# Patient Record
Sex: Male | Born: 1935 | Race: White | Hispanic: No | Marital: Married
Health system: Southern US, Community
[De-identification: ages and names within clinical notes are randomized; demographics above are authoritative.]

---

## 2015-09-17 ENCOUNTER — Inpatient Hospital Stay
Admission: EM | Admit: 2015-09-17 | Discharge: 2015-10-21 | Disposition: A | Discharge status: Rehabilitation Facility | Payer: Medicare Other | Source: Intra-hospital | Attending: Internal Medicine | Admitting: Internal Medicine

## 2015-09-17 ENCOUNTER — Other Ambulatory Visit (HOSPITAL_COMMUNITY): Payer: Medicare Other

## 2015-09-17 DIAGNOSIS — IMO0002 Reserved for concepts with insufficient information to code with codable children: Secondary | ICD-10-CM

## 2015-09-17 DIAGNOSIS — R188 Other ascites: Secondary | ICD-10-CM

## 2015-09-17 DIAGNOSIS — R0602 Shortness of breath: Secondary | ICD-10-CM

## 2015-09-17 DIAGNOSIS — T8131XS Disruption of external operation (surgical) wound, not elsewhere classified, sequela: Secondary | ICD-10-CM

## 2015-09-17 DIAGNOSIS — R1310 Dysphagia, unspecified: Secondary | ICD-10-CM

## 2015-09-17 DIAGNOSIS — L0291 Cutaneous abscess, unspecified: Secondary | ICD-10-CM

## 2015-09-17 DIAGNOSIS — Z4659 Encounter for fitting and adjustment of other gastrointestinal appliance and device: Secondary | ICD-10-CM

## 2015-09-17 DIAGNOSIS — J189 Pneumonia, unspecified organism: Secondary | ICD-10-CM

## 2015-09-17 DIAGNOSIS — J9690 Respiratory failure, unspecified, unspecified whether with hypoxia or hypercapnia: Secondary | ICD-10-CM

## 2015-09-17 DIAGNOSIS — Z95828 Presence of other vascular implants and grafts: Secondary | ICD-10-CM

## 2015-09-17 DIAGNOSIS — K651 Peritoneal abscess: Secondary | ICD-10-CM

## 2015-09-17 DIAGNOSIS — Z931 Gastrostomy status: Secondary | ICD-10-CM

## 2015-09-18 LAB — COMPREHENSIVE METABOLIC PANEL
ALK PHOS: 130 U/L — AB (ref 38–126)
ALT: 27 U/L (ref 17–63)
ANION GAP: 8 (ref 5–15)
AST: 53 U/L — ABNORMAL HIGH (ref 15–41)
Albumin: 2 g/dL — ABNORMAL LOW (ref 3.5–5.0)
BUN: 20 mg/dL (ref 6–20)
CALCIUM: 8.3 mg/dL — AB (ref 8.9–10.3)
CO2: 27 mmol/L (ref 22–32)
CREATININE: 0.66 mg/dL (ref 0.61–1.24)
Chloride: 101 mmol/L (ref 101–111)
Glucose, Bld: 41 mg/dL — CL (ref 65–99)
Potassium: 3.4 mmol/L — ABNORMAL LOW (ref 3.5–5.1)
Sodium: 136 mmol/L (ref 135–145)
TOTAL PROTEIN: 5 g/dL — AB (ref 6.5–8.1)
Total Bilirubin: 0.8 mg/dL (ref 0.3–1.2)

## 2015-09-18 LAB — CBC WITH DIFFERENTIAL/PLATELET
Basophils Absolute: 0 10*3/uL (ref 0.0–0.1)
Basophils Relative: 0 %
EOS PCT: 0 %
Eosinophils Absolute: 0 10*3/uL (ref 0.0–0.7)
HEMATOCRIT: 28 % — AB (ref 39.0–52.0)
Hemoglobin: 9.3 g/dL — ABNORMAL LOW (ref 13.0–17.0)
LYMPHS ABS: 2.6 10*3/uL (ref 0.7–4.0)
LYMPHS PCT: 25 %
MCH: 28.1 pg (ref 26.0–34.0)
MCHC: 33.2 g/dL (ref 30.0–36.0)
MCV: 84.6 fL (ref 78.0–100.0)
MONO ABS: 1 10*3/uL (ref 0.1–1.0)
MONOS PCT: 10 %
Neutro Abs: 6.6 10*3/uL (ref 1.7–7.7)
Neutrophils Relative %: 65 %
PLATELETS: 413 10*3/uL — AB (ref 150–400)
RBC: 3.31 MIL/uL — ABNORMAL LOW (ref 4.22–5.81)
RDW: 18.1 % — AB (ref 11.5–15.5)
WBC: 10.3 10*3/uL (ref 4.0–10.5)

## 2015-09-18 LAB — PHOSPHORUS: Phosphorus: 3.6 mg/dL (ref 2.5–4.6)

## 2015-09-18 LAB — MAGNESIUM: MAGNESIUM: 1.7 mg/dL (ref 1.7–2.4)

## 2015-09-18 LAB — TSH: TSH: 4.128 u[IU]/mL (ref 0.350–4.500)

## 2015-09-18 LAB — PROCALCITONIN: Procalcitonin: 0.23 ng/mL

## 2015-09-18 LAB — PROTIME-INR
INR: 1.45 (ref 0.00–1.49)
Prothrombin Time: 17.7 seconds — ABNORMAL HIGH (ref 11.6–15.2)

## 2015-09-19 LAB — HEMOGLOBIN A1C
HEMOGLOBIN A1C: 6.4 % — AB (ref 4.8–5.6)
MEAN PLASMA GLUCOSE: 137 mg/dL

## 2015-09-19 LAB — AMIKACIN LEVEL

## 2015-09-21 ENCOUNTER — Other Ambulatory Visit (HOSPITAL_COMMUNITY): Payer: Medicare Other

## 2015-09-21 LAB — BASIC METABOLIC PANEL
Anion gap: 3 — ABNORMAL LOW (ref 5–15)
BUN: 14 mg/dL (ref 6–20)
CHLORIDE: 107 mmol/L (ref 101–111)
CO2: 24 mmol/L (ref 22–32)
Calcium: 7.4 mg/dL — ABNORMAL LOW (ref 8.9–10.3)
Creatinine, Ser: 0.71 mg/dL (ref 0.61–1.24)
Glucose, Bld: 96 mg/dL (ref 65–99)
POTASSIUM: 3.3 mmol/L — AB (ref 3.5–5.1)
SODIUM: 134 mmol/L — AB (ref 135–145)

## 2015-09-21 LAB — CBC
HCT: 22.9 % — ABNORMAL LOW (ref 39.0–52.0)
HEMOGLOBIN: 7.1 g/dL — AB (ref 13.0–17.0)
MCH: 26.8 pg (ref 26.0–34.0)
MCHC: 31 g/dL (ref 30.0–36.0)
MCV: 86.4 fL (ref 78.0–100.0)
Platelets: 206 10*3/uL (ref 150–400)
RBC: 2.65 MIL/uL — AB (ref 4.22–5.81)
RDW: 16.5 % — ABNORMAL HIGH (ref 11.5–15.5)
WBC: 9.9 10*3/uL (ref 4.0–10.5)

## 2015-09-22 ENCOUNTER — Other Ambulatory Visit (HOSPITAL_COMMUNITY): Payer: Medicare Other

## 2015-09-22 LAB — MAGNESIUM: Magnesium: 1.7 mg/dL (ref 1.7–2.4)

## 2015-09-22 LAB — BASIC METABOLIC PANEL
Anion gap: 6 (ref 5–15)
BUN: 16 mg/dL (ref 6–20)
CO2: 22 mmol/L (ref 22–32)
Calcium: 7.8 mg/dL — ABNORMAL LOW (ref 8.9–10.3)
Chloride: 107 mmol/L (ref 101–111)
Creatinine, Ser: 0.78 mg/dL (ref 0.61–1.24)
GFR calc Af Amer: >60 mL/min (ref >60)
GLUCOSE: 94 mg/dL (ref 65–99)
POTASSIUM: 3.2 mmol/L — AB (ref 3.5–5.1)
Sodium: 135 mmol/L (ref 135–145)

## 2015-09-22 LAB — CBC
HEMATOCRIT: 23.2 % — AB (ref 39.0–52.0)
Hemoglobin: 7.4 g/dL — ABNORMAL LOW (ref 13.0–17.0)
MCH: 27.4 pg (ref 26.0–34.0)
MCHC: 31.9 g/dL (ref 30.0–36.0)
MCV: 85.9 fL (ref 78.0–100.0)
PLATELETS: 202 10*3/uL (ref 150–400)
RBC: 2.7 MIL/uL — AB (ref 4.22–5.81)
RDW: 16.3 % — ABNORMAL HIGH (ref 11.5–15.5)
WBC: 11.7 10*3/uL — ABNORMAL HIGH (ref 4.0–10.5)

## 2015-09-22 LAB — URINALYSIS, ROUTINE W REFLEX MICROSCOPIC
Bilirubin Urine: NEGATIVE
GLUCOSE, UA: NEGATIVE mg/dL
Hgb urine dipstick: NEGATIVE
KETONES UR: NEGATIVE mg/dL
LEUKOCYTES UA: NEGATIVE
NITRITE: NEGATIVE
PROTEIN: 100 mg/dL — AB
Specific Gravity, Urine: 1.027 (ref 1.005–1.030)
pH: 6 (ref 5.0–8.0)

## 2015-09-22 LAB — URINE MICROSCOPIC-ADD ON

## 2015-09-23 LAB — URINE CULTURE: Culture: 1000 — AB

## 2015-09-23 NOTE — Progress Notes (Signed)
Patient ID: Marinell BlightJames Spoon, male   DOB: [birth year], 82 y.o.   MRN: [id number]   Request for perc G tube received  CT Abd 7/18: IMPRESSION: 1. Abnormal examination. There is a large serpentine loculated fluid collection within the peritoneal cavity occupying a significant percentage of the peritoneal space and displacing loops of bowel. The collection is not imaged in its entirety and is a amorphous in shape making measurement difficult. There is a second more circumscribed and smaller fluid collection measuring 5.4 x 10.5 cm in the lesser sac. Differential considerations are broad and include loculated ascites due to a multitude of reasons including infection, inflammation, malignancy, prior scar tissue, or in the appropriate clinical setting prior peritoneal dialysis, large pancreatic pseudocysts, and potentially intra-abdominal abscess although this would be unlikely unless the patient were clinically symptomatic. Recommend further evaluation with CT scan of the abdomen and pelvis with oral and intravenous contrast. 2. The tip of the enteric feeding tube is in the proximal jejunum. 3. Bilateral moderate pleural effusions with associated lower lobe atelectasis. 4. Aortic Atherosclerosis (ICD10-170.0). 5. Cholelithiasis.  NOT candidate for percutaneous G tube at this time Note in [facility] chart Can re request after full work up if needed

## 2015-09-24 ENCOUNTER — Other Ambulatory Visit (HOSPITAL_COMMUNITY): Payer: Medicare Other

## 2015-09-24 LAB — VANCOMYCIN, TROUGH: VANCOMYCIN TR: 17 ug/mL (ref 15–20)

## 2015-09-24 MED ORDER — IOPAMIDOL (ISOVUE-300) INJECTION 61%
100.0000 mL | Freq: Once | INTRAVENOUS | Status: AC | PRN
  Administered 2015-09-24: 100 mL via INTRAVENOUS

## 2015-09-24 NOTE — Consult Note (Signed)
Chief Complaint: Patient was seen in consultation today for abdominal abscess(es) drain placement at the request of Dr Carron Curie  Referring Physician(s): Dr Carron Curie  Supervising Physician: Simonne Come  Patient Status: Inpatient  History of Present Illness: Julian Cordova is a 82 y.o. male   Subtotal colectomy 05/2015 Pneumoperitoneum Admitted to [facility] for sepsis; deconditioning  CT 7/20: IMPRESSION: 1. Bilateral pleural effusions with atelectasis. 2. Fluid collections in the abdomen and pelvis as described above. Multiple differential possibilities are possible as detailed in the CT of the abdomen from September 22, 2015. In the appropriate clinical setting, these fluid collections could represent abscesses. However, I would expect the patient to be systemically ill if these are indeed abscesses. . In the lesser sac there is a collection measuring 11 by 5 cm, not significantly changed in the interval. There is a large serpiginous collection encompassing much of the right and left abdomen, extending into the pelvis. It is very difficult to measure the abnormal fluid collections which mostly appear to be contiguous given their unusual shape. A large component is seen in the left side of the abdomen on coronal image 42 measuring 22 by 8 cm. A large component in the right side of the abdomen measures at least 14 by 13 cm. No free flowing fluid identified.  3. Cholelithiasis. There is fluid adjacent to the gallbladder. An ultrasound could better evaluate the gallbladder if clinically warranted. 4. Atherosclerosis in the abdominal aorta. 5. No other acute abnormalities  Request made for abscess (es) drain placement Dr Grace Isaac has reviewed imaging and agreeable to proceed Will need at least 1 drain ; possibly more   No past medical history on file.  No past surgical history on file.  Allergies: Review of patient's allergies indicates not on file.  Medications: Prior to  Admission medications   Not on File     No family history on file.  Social History   Social History  . Marital Status: Married    Spouse Name: N/A  . Number of Children: N/A  . Years of Education: N/A   Social History Main Topics  . Smoking status: Not on file  . Smokeless tobacco: Not on file  . Alcohol Use: Not on file  . Drug Use: Not on file  . Sexual Activity: Not on file   Other Topics Concern  . Not on file   Social History Narrative  . No narrative on file     Review of Systems: A 12 point ROS discussed and pertinent positives are indicated in the HPI above.  All other systems are negative.  Review of Systems  Constitutional:       Cachectic     Vital Signs: There were no vitals taken for this visit.  Physical Exam  Constitutional:  Ill appearing Non verbal   Cardiovascular: Normal rate and regular rhythm.   Pulmonary/Chest: Effort normal and breath sounds normal.  Abdominal: Bowel sounds are normal. He exhibits no distension.  Musculoskeletal:  In fetal position Legs pulled to chest Not contracture per Phys Therapist ---PT was able straighten pts legs to approx 45 degrees- witnessed by me  Neurological:  Follows no commands Not communicative  Skin: Skin is warm.  Psychiatric:  Consented with daughter on phone  Nursing note and vitals reviewed.   Mallampati Score:  MD Evaluation Airway: WNL Heart: WNL Abdomen: WNL Chest/ Lungs: WNL ASA  Classification: 3 Mallampati/Airway Score: Two  Imaging: Ct Abdomen Wo Contrast  09/22/2015  CLINICAL  DATA:  82 year old male with a history of dysphagia. Evaluate anatomic candidacy for percutaneous gastrostomy tube placement. EXAM: CT ABDOMEN WITHOUT CONTRAST TECHNIQUE: Multidetector CT imaging of the abdomen was performed following the standard protocol without IV contrast. COMPARISON:  Abdominal radiograph 09/17/2015 FINDINGS: Lower chest: Bilateral moderate layering pleural effusions with  associated lower lobe atelectasis. Calcification and thickening of the aortic valve. Coronary artery calcifications also noted. No cardiomegaly. No evidence of pericardial effusion. Hepatobiliary: Normal hepatic contour and morphology. No discrete hepatic lesion. Gallstone present within the gallbladder lumen. Pancreas: Atrophic pancreas. Spleen: Within normal limits in size. Adrenals/Urinary Tract: No evidence of urolithiasis or hydronephrosis. No definite mass visualized on this un-enhanced exam. Stomach/Bowel: 5.4 x 10.5 cm low-attenuation fluid collection in the lesser sac displacing the stomach anteriorly. Additionally, there are multiple other large loculated fluid collections which appear to communicate and occupying significant portion of the abdomen. An enteric feeding tube is present. The tip of the tube lies in the proximal jejunum. Vascular/Lymphatic: No pathologically enlarged lymph nodes. No evidence of abdominal aortic aneurysm. Calcifications are present throughout the course the abdominal aorta. Other: None. Musculoskeletal:  No suspicious bone lesions identified. IMPRESSION: 1. Abnormal examination. There is a large serpentine loculated fluid collection within the peritoneal cavity occupying a significant percentage of the peritoneal space and displacing loops of bowel. The collection is not imaged in its entirety and is a amorphous in shape making measurement difficult. There is a second more circumscribed and smaller fluid collection measuring 5.4 x 10.5 cm in the lesser sac. Differential considerations are broad and include loculated ascites due to a multitude of reasons including infection, inflammation, malignancy, prior scar tissue, or in the appropriate clinical setting prior peritoneal dialysis, large pancreatic pseudocysts, and potentially intra-abdominal abscess although this would be unlikely unless the patient were clinically symptomatic. Recommend further evaluation with CT scan of the  abdomen and pelvis with oral and intravenous contrast. 2. The tip of the enteric feeding tube is in the proximal jejunum. 3. Bilateral moderate pleural effusions with associated lower lobe atelectasis. 4. Aortic Atherosclerosis (ICD10-170.0). 5. Cholelithiasis. These results will be called to the ordering clinician or representative by the Radiologist Assistant, and communication documented in the PACS or zVision Dashboard. Electronically Signed   By: Malachy Moan M.D.   On: 09/22/2015 17:03   Ct Chest W Contrast  09/24/2015  CLINICAL DATA:  Shortness of breath. Evaluate fluid collections in the abdomen. EXAM: CT CHEST, ABDOMEN, AND PELVIS WITH CONTRAST TECHNIQUE: Multidetector CT imaging of the chest, abdomen and pelvis was performed following the standard protocol during bolus administration of intravenous contrast. CONTRAST:  ISOVUE-300 IOPAMIDOL (ISOVUE-300) INJECTION 61% COMPARISON:  September 22, 2015 FINDINGS: The patient pulled out his IV and there is very little contrast on the study. CT CHEST A tracheostomy tube is in place. There are bilateral pleural effusions with underlying atelectasis. No suspicious nodules or masses. Shotty reactive nodes in the mediastinum with no adenopathy. The central great vessels are normal in caliber. There are coronary artery calcifications. The heart size normal. No pericardial effusion. CT ABDOMEN AND PELVIS No free air. The patient is status post ostomy placement in the right lower quadrant. The oral contrast all exits via the ostomy and does not extend more distally. There is a feeding tube in the stomach. The stomach is otherwise normal. Visualized small bowel is unremarkable. The patient is status post subtotal colectomy. There is fluid in the rectum surrounding a stool ball. It is difficult to track the  colon more proximally but I believe most of the colon is has been removed. Again noted are multiple fluid collections in the abdomen. In the lesser sac there  is a collection measuring 11 by 5 cm, not significantly changed in the interval. There is a large serpiginous collection encompassing much of the right and left abdomen, extending into the pelvis. It is very difficult to measure the abnormal fluid collections which mostly appear to be contiguous given their unusual shape. A large component is seen in the left side of the abdomen on coronal image 42 measuring 22 by 8 cm. A large component in the right side of the abdomen measures at least 14 by 13 cm. No free flowing fluid identified. No free air. There is fluid adjacent to the gallbladder and stones noted within the gallbladder. No wall thickening. The liver, spleen, adrenal glands, and pancreas are unremarkable. The abdominal aorta is atherosclerotic but non aneurysmal. No suspicious adenopathy. In the pelvis, there is no adenopathy. The rectum is fluid filled with stool ball as described above. The bladder is decompressed with a Foley catheter but appears to be thick-walled superiorly. No other abnormalities in the pelvis. No acute bony abnormality identified. IMPRESSION: 1. Bilateral pleural effusions with atelectasis. 2. Fluid collections in the abdomen and pelvis as described above. Multiple differential possibilities are possible as detailed in the CT of the abdomen from September 22, 2015. In the appropriate clinical setting, these fluid collections could represent abscesses. However, I would expect the patient to be systemically ill if these are indeed abscesses. 3. Cholelithiasis. There is fluid adjacent to the gallbladder. An ultrasound could better evaluate the gallbladder if clinically warranted. 4. Atherosclerosis in the abdominal aorta. 5. No other acute abnormalities. Electronically Signed   By: Gerome Samavid  Williams III M.D   On: 09/24/2015 04:10   Ct Abdomen Pelvis W Contrast  09/24/2015  CLINICAL DATA:  Shortness of breath. Evaluate fluid collections in the abdomen. EXAM: CT CHEST, ABDOMEN, AND PELVIS WITH  CONTRAST TECHNIQUE: Multidetector CT imaging of the chest, abdomen and pelvis was performed following the standard protocol during bolus administration of intravenous contrast. CONTRAST:  100mL ISOVUE-300 IOPAMIDOL (ISOVUE-300) INJECTION 61% COMPARISON:  September 22, 2015 FINDINGS: The patient pulled out his IV and there is very little contrast on the study. CT CHEST A tracheostomy tube is in place. There are bilateral pleural effusions with underlying atelectasis. No suspicious nodules or masses. Shotty reactive nodes in the mediastinum with no adenopathy. The central great vessels are normal in caliber. There are coronary artery calcifications. The heart size normal. No pericardial effusion. CT ABDOMEN AND PELVIS No free air. The patient is status post ostomy placement in the right lower quadrant. The oral contrast all exits via the ostomy and does not extend more distally. There is a feeding tube in the stomach. The stomach is otherwise normal. Visualized small bowel is unremarkable. The patient is status post subtotal colectomy. There is fluid in the rectum surrounding a stool ball. It is difficult to track the colon more proximally but I believe most of the colon is has been removed. Again noted are multiple fluid collections in the abdomen. In the lesser sac there is a collection measuring 11 by 5 cm, not significantly changed in the interval. There is a large serpiginous collection encompassing much of the right and left abdomen, extending into the pelvis. It is very difficult to measure the abnormal fluid collections which mostly appear to be contiguous given their unusual shape. A large  component is seen in the left side of the abdomen on coronal image 42 measuring 22 by 8 cm. A large component in the right side of the abdomen measures at least 14 by 13 cm. No free flowing fluid identified. No free air. There is fluid adjacent to the gallbladder and stones noted within the gallbladder. No wall thickening. The  liver, spleen, adrenal glands, and pancreas are unremarkable. The abdominal aorta is atherosclerotic but non aneurysmal. No suspicious adenopathy. In the pelvis, there is no adenopathy. The rectum is fluid filled with stool ball as described above. The bladder is decompressed with a Foley catheter but appears to be thick-walled superiorly. No other abnormalities in the pelvis. No acute bony abnormality identified. IMPRESSION: 1. Bilateral pleural effusions with atelectasis. 2. Fluid collections in the abdomen and pelvis as described above. Multiple differential possibilities are possible as detailed in the CT of the abdomen from September 22, 2015. In the appropriate clinical setting, these fluid collections could represent abscesses. However, I would expect the patient to be systemically ill if these are indeed abscesses. 3. Cholelithiasis. There is fluid adjacent to the gallbladder. An ultrasound could better evaluate the gallbladder if clinically warranted. 4. Atherosclerosis in the abdominal aorta. 5. No other acute abnormalities. Electronically Signed   By: Gerome Sam III M.D   On: 09/24/2015 04:10   Dg Chest Port 1 View  09/21/2015  CLINICAL DATA:  Pneumonia EXAM: PORTABLE CHEST 1 VIEW COMPARISON:  Portable chest x-ray of September 17, 2015 FINDINGS: The lungs are adequately inflated. Bilateral pleural effusions are more conspicuous perhaps in part due to positioning in a more upright manner. The interstitial markings remain increased. The cardiac silhouette is normal in size. The pulmonary vascularity is indistinct. The tracheostomy appliance tip projects 5 cm above the carina. There is aortic atherosclerosis. The feeding tube tip projects below the inferior margin of the image. IMPRESSION: Moderate-sized bilateral pleural effusions. Left basilar atelectasis or pneumonia. Mild interstitial prominence compatible with edema. The support tubes are in stable position. Aortic atherosclerosis. Electronically Signed    By: David  [place] M.D.   On: 09/21/2015 07:18   Dg Chest Port 1 View  09/17/2015  CLINICAL DATA:  Nasogastric tube placement, respiratory status EXAM: PORTABLE CHEST 1 VIEW COMPARISON:  Portable exam 1629 hours without priors for comparison FINDINGS: Rotated to the RIGHT. Tip of tracheostomy tube projects 5.6 cm above carina. Feeding tube traverses stomach with tip in LEFT upper quadrant question in proximal jejunum. Bibasilar opacities question layered pleural effusions and basilar atelectasis. Skin folds project over LEFT lung. No pneumothorax. Bones demineralized. IMPRESSION: Line and tube positions as above. Bibasilar opacities question small pleural effusions and atelectasis. Electronically Signed   By: Ulyses Southward M.D.   On: 09/17/2015 17:00   Dg Abd Portable 1v  09/17/2015  CLINICAL DATA:  Nasogastric tube placement EXAM: PORTABLE ABDOMEN - 1 VIEW COMPARISON:  Portable exam 1633 hours without priors for comparison. FINDINGS: Feeding tube traverses the esophagus and stomach. Tip is reflected into the LEFT upper quadrant, suspect within proximal jejunum. Paucity of bowel gas. Bibasilar opacities as noted on chest radiograph. Bones demineralized. IMPRESSION: Tip of feeding tube projects over LEFT upper quadrant, suspect within proximal jejunum ; definitive confirmation of feeding tube position can be achieved to injection of a small amount of contrast through the tube and repeat imaging. Electronically Signed   By: Ulyses Southward M.D.   On: 09/17/2015 17:03    Labs:  CBC:  Recent Labs  09/18/15  9147 09/21/15 0500 09/22/15 0714  WBC 10.3 9.9 11.7*  HGB 9.3* 7.1* 7.4*  HCT 28.0* 22.9* 23.2*  PLT 413* 206 202    COAGS:  Recent Labs  09/18/15 0620  INR 1.45    BMP:  Recent Labs  09/18/15 0702 09/21/15 0500 09/22/15 0714  NA 136 134* 135  K 3.4* 3.3* 3.2*  CL 101 107 107  CO2 27 24 22   GLUCOSE 41* 96 94  BUN 20 14 16   CALCIUM 8.3* 7.4* 7.8*  CREATININE 0.66 0.71 0.78    GFRNONAA >60 >60 >60  GFRAA >60 >60 >60    LIVER FUNCTION TESTS:  Recent Labs  09/18/15 0702  BILITOT 0.8  AST 53*  ALT 27  ALKPHOS 130*  PROT 5.0*  ALBUMIN 2.0*    TUMOR MARKERS: No results for input(s): AFPTM, CEA, CA199, CHROMGRNA in the last 8760 hours.  Assessment and Plan:  Abdominal abscesses Scheduled for drain(s) 7/21 in IR Risks and Benefits discussed with the patient's daughter via phone including bleeding, infection, damage to adjacent structures, bowel perforation/fistula connection, and sepsis. All of the patient's daughters questions were answered, agreeable to proceed. Consent signed and in chart.   Thank you for this interesting consult.  I greatly enjoyed meeting Lorenzo Arscott and look forward to participating in their care.  A copy of this report was sent to the requesting provider on this date.  Electronically Signed: Ralene Muskrat A 09/24/2015, 11:17 AM   I spent a total of 40 Minutes    in face to face in clinical consultation, greater than 50% of which was counseling/coordinating care for abd abscess drains

## 2015-09-25 ENCOUNTER — Other Ambulatory Visit (HOSPITAL_COMMUNITY): Payer: Medicare Other

## 2015-09-25 LAB — BASIC METABOLIC PANEL
ANION GAP: 3 — AB (ref 5–15)
BUN: 18 mg/dL (ref 6–20)
CALCIUM: 7.9 mg/dL — AB (ref 8.9–10.3)
CO2: 24 mmol/L (ref 22–32)
CREATININE: 0.64 mg/dL (ref 0.61–1.24)
Chloride: 108 mmol/L (ref 101–111)
GFR calc Af Amer: >60 mL/min (ref >60)
GFR calc non Af Amer: >60 mL/min (ref >60)
GLUCOSE: 83 mg/dL (ref 65–99)
Potassium: 2.9 mmol/L — ABNORMAL LOW (ref 3.5–5.1)
Sodium: 135 mmol/L (ref 135–145)

## 2015-09-25 LAB — CBC
HCT: 19.2 % — ABNORMAL LOW (ref 39.0–52.0)
HEMATOCRIT: 20.3 % — AB (ref 39.0–52.0)
Hemoglobin: 6.2 g/dL — CL (ref 13.0–17.0)
Hemoglobin: 6.1 g/dL — CL (ref 13.0–17.0)
MCH: 26.5 pg (ref 26.0–34.0)
MCH: 27.1 pg (ref 26.0–34.0)
MCHC: 30.5 g/dL (ref 30.0–36.0)
MCHC: 31.8 g/dL (ref 30.0–36.0)
MCV: 86.8 fL (ref 78.0–100.0)
MCV: 85.3 fL (ref 78.0–100.0)
PLATELETS: 230 10*3/uL (ref 150–400)
Platelets: 248 10*3/uL (ref 150–400)
RBC: 2.34 MIL/uL — ABNORMAL LOW (ref 4.22–5.81)
RBC: 2.25 MIL/uL — AB (ref 4.22–5.81)
RDW: 16.2 % — AB (ref 11.5–15.5)
RDW: 16.1 % — ABNORMAL HIGH (ref 11.5–15.5)
WBC: 11.6 10*3/uL — ABNORMAL HIGH (ref 4.0–10.5)
WBC: 11.3 10*3/uL — ABNORMAL HIGH (ref 4.0–10.5)

## 2015-09-25 LAB — MAGNESIUM: Magnesium: 1.8 mg/dL (ref 1.7–2.4)

## 2015-09-25 MED ORDER — MIDAZOLAM HCL 2 MG/2ML IJ SOLN
INTRAMUSCULAR | Status: AC
  Filled 2015-09-25: qty 2

## 2015-09-25 MED ORDER — MIDAZOLAM HCL 2 MG/2ML IJ SOLN
INTRAMUSCULAR | Status: AC | PRN
  Administered 2015-09-25 (×3): 0.5 mg via INTRAVENOUS

## 2015-09-25 NOTE — Sedation Documentation (Signed)
Patient is resting comfortably. 

## 2015-09-25 NOTE — Sedation Documentation (Signed)
One drain in, will place 2nd drain

## 2015-09-25 NOTE — Sedation Documentation (Signed)
2 JP bulb drains in place

## 2015-09-25 NOTE — Sedation Documentation (Signed)
Pt has needed trache suction several times, 02 trache collar on, sats drop to mid- high 80's when needs suctioning

## 2015-09-25 NOTE — Sedation Documentation (Signed)
Patient slightly restless 

## 2015-09-25 NOTE — Procedures (Signed)
Interventional Radiology Procedure Note  Procedure: Peritoneal abscess drainage catheter placement x 2  Complications: None  Estimated Blood Loss: < 10 mL  12 Fr drains placed in inferior and superior areas of large intraabdominal abscesses Turbid yellow fluid draining.  Sample sent for culture. Both drains placed to suction bulb drainage.  Jodi MarbleGlenn T. Fredia SorrowYamagata, M.D Pager:  [phone]

## 2015-09-26 LAB — MAGNESIUM: Magnesium: 1.8 mg/dL (ref 1.7–2.4)

## 2015-09-26 LAB — CATH TIP CULTURE: Culture: NO GROWTH

## 2015-09-26 LAB — COMPREHENSIVE METABOLIC PANEL
ALT: 15 U/L — AB (ref 17–63)
AST: 19 U/L (ref 15–41)
Albumin: <1 g/dL — ABNORMAL LOW (ref 3.5–5.0)
Alkaline Phosphatase: 74 U/L (ref 38–126)
Anion gap: 5 (ref 5–15)
BUN: 17 mg/dL (ref 6–20)
CHLORIDE: 109 mmol/L (ref 101–111)
CO2: 24 mmol/L (ref 22–32)
CREATININE: 0.66 mg/dL (ref 0.61–1.24)
Calcium: 8.1 mg/dL — ABNORMAL LOW (ref 8.9–10.3)
GFR calc Af Amer: >60 mL/min (ref >60)
Glucose, Bld: 105 mg/dL — ABNORMAL HIGH (ref 65–99)
Potassium: 3.6 mmol/L (ref 3.5–5.1)
SODIUM: 138 mmol/L (ref 135–145)
Total Bilirubin: 0.5 mg/dL (ref 0.3–1.2)
Total Protein: 4.4 g/dL — ABNORMAL LOW (ref 6.5–8.1)

## 2015-09-26 LAB — CULTURE, BLOOD (ROUTINE X 2)
CULTURE: NO GROWTH
Culture: NO GROWTH

## 2015-09-26 LAB — CBC
HCT: 20.7 % — ABNORMAL LOW (ref 39.0–52.0)
Hemoglobin: 6.3 g/dL — CL (ref 13.0–17.0)
MCH: 26.7 pg (ref 26.0–34.0)
MCHC: 30.4 g/dL (ref 30.0–36.0)
MCV: 87.7 fL (ref 78.0–100.0)
PLATELETS: 268 10*3/uL (ref 150–400)
RBC: 2.36 MIL/uL — AB (ref 4.22–5.81)
RDW: 16.4 % — ABNORMAL HIGH (ref 11.5–15.5)
WBC: 9.1 10*3/uL (ref 4.0–10.5)

## 2015-09-26 LAB — ABO/RH: ABO/RH(D): O POS

## 2015-09-26 LAB — PREPARE RBC (CROSSMATCH)

## 2015-09-27 LAB — TYPE AND SCREEN
ABO/RH(D): O POS
Antibody Screen: NEGATIVE
Unit division: 0

## 2015-09-27 LAB — CBC
HCT: 26 % — ABNORMAL LOW (ref 39.0–52.0)
HEMOGLOBIN: 8.1 g/dL — AB (ref 13.0–17.0)
MCH: 27.3 pg (ref 26.0–34.0)
MCHC: 31.2 g/dL (ref 30.0–36.0)
MCV: 87.5 fL (ref 78.0–100.0)
PLATELETS: 304 10*3/uL (ref 150–400)
RBC: 2.97 MIL/uL — AB (ref 4.22–5.81)
RDW: 16 % — ABNORMAL HIGH (ref 11.5–15.5)
WBC: 8.8 10*3/uL (ref 4.0–10.5)

## 2015-09-27 LAB — BASIC METABOLIC PANEL
Anion gap: 3 — ABNORMAL LOW (ref 5–15)
BUN: 15 mg/dL (ref 6–20)
CHLORIDE: 110 mmol/L (ref 101–111)
CO2: 25 mmol/L (ref 22–32)
Calcium: 8.3 mg/dL — ABNORMAL LOW (ref 8.9–10.3)
Creatinine, Ser: 0.44 mg/dL — ABNORMAL LOW (ref 0.61–1.24)
GFR calc Af Amer: >60 mL/min (ref >60)
GFR calc non Af Amer: >60 mL/min (ref >60)
GLUCOSE: 99 mg/dL (ref 65–99)
POTASSIUM: 3.8 mmol/L (ref 3.5–5.1)
Sodium: 138 mmol/L (ref 135–145)

## 2015-09-27 LAB — MAGNESIUM: Magnesium: 1.8 mg/dL (ref 1.7–2.4)

## 2015-09-27 LAB — VANCOMYCIN, TROUGH: Vancomycin Tr: 30 ug/mL (ref 15–20)

## 2015-09-28 LAB — VANCOMYCIN, RANDOM: Vancomycin Rm: 18

## 2015-09-29 NOTE — Progress Notes (Signed)
Patient ID: Julian Cordova, male   DOB: 1926/06/11, 82 y.o.   MRN: [id number]    Referring Physician(s): Dr. Carron Curie  Supervising Physician: Ruel Favors  Patient Status: inpt  Chief Complaint: Intra-abdominal fluid collections  Subjective: Patient laying in bed and does not respond to questions.  Allergies: Review of patient's allergies indicates no known allergies.  Medications: Prior to Admission medications   Not on File    Vital Signs: BP (!) 148/64   Pulse 82   Resp (!) 22   SpO2 100%   Physical Exam: Abd: soft, stool has clearly leaked out of his ostomy onto his abdomen.  He is contractured with his legs up in his abdomen.  Both drains are in place with cloudy serous output.  Unable to tell from documentation how much output he is having.  There is at least 20-25cc present in each bulb currently.  Imaging: Ct Image Guided Drainage By Percutaneous Catheter  Result Date: 09/26/2015 CLINICAL DATA:  Large intra-abdominal abscesses requiring percutaneous drainage. EXAM: CT GUIDED CATHETER DRAINAGE OF PERITONEAL ABSCESSES X 2 ANESTHESIA/SEDATION: 1.5 Mg IV Versed Total Moderate Sedation Time:  20 minutes. PROCEDURE: The procedure, risks, benefits, and alternatives were explained to the patient's daughter. Questions regarding the procedure were encouraged and answered. The patient's daughter understands and consents to the procedure. The abdominal wall was prepped with chlorhexidine in a sterile fashion, and a sterile drape was applied covering the operative field. A sterile gown and sterile gloves were used for the procedure. Local anesthesia was provided with 1% Lidocaine. CT was performed of the abdomen and pelvis in a supine position. From a left-sided approach, an 18 gauge trocar needle was advanced into the left pelvic portion of a large peritoneal abscess. Fluid aspiration was performed through the needle. A guidewire was advanced. The percutaneous tract was dilated and a  12 French pigtail drainage catheter placed. The catheter was connected to suction bulb drainage and secured at the skin with a Prolene retention suture and StatLock device. Superior component of a large anterior peritoneal abscess was then localized. From a left anterior approach, an 18 gauge trocar needle was advanced into this collection. After aspirating fluid, a guidewire was advanced and the tract dilated. A 12 French pigtail drainage catheter was then advanced over the wire. This catheter was connected to suction bulb drainage and secured at the skin with a Prolene retention suture and StatLock device. COMPLICATIONS: None FINDINGS: Fluid aspirated from pelvic and more superior abdominal components of large peritoneal abscess fluid collections yielded cloudy yellow fluid. A sample was sent for culture analysis. IMPRESSION: Drainage of 2 separate components of massive intraperitoneal abscesses under CT guidance with placement of 2 separate 12 French pigtail drains. Both drains were connected to suction bulb drainage. Electronically Signed   By: Irish Lack M.D.   On: 09/26/2015 11:16   Ct Image Guided Drainage By Percutaneous Catheter  Result Date: 09/26/2015 CLINICAL DATA:  Large intra-abdominal abscesses requiring percutaneous drainage. EXAM: CT GUIDED CATHETER DRAINAGE OF PERITONEAL ABSCESSES X 2 ANESTHESIA/SEDATION: 1.5 Mg IV Versed Total Moderate Sedation Time:  20 minutes. PROCEDURE: The procedure, risks, benefits, and alternatives were explained to the patient's daughter. Questions regarding the procedure were encouraged and answered. The patient's daughter understands and consents to the procedure. The abdominal wall was prepped with chlorhexidine in a sterile fashion, and a sterile drape was applied covering the operative field. A sterile gown and sterile gloves were used for the procedure. Local anesthesia was provided with 1%  Lidocaine. CT was performed of the abdomen and pelvis in a supine  position. From a left-sided approach, an 18 gauge trocar needle was advanced into the left pelvic portion of a large peritoneal abscess. Fluid aspiration was performed through the needle. A guidewire was advanced. The percutaneous tract was dilated and a 12 French pigtail drainage catheter placed. The catheter was connected to suction bulb drainage and secured at the skin with a Prolene retention suture and StatLock device. Superior component of a large anterior peritoneal abscess was then localized. From a left anterior approach, an 18 gauge trocar needle was advanced into this collection. After aspirating fluid, a guidewire was advanced and the tract dilated. A 12 French pigtail drainage catheter was then advanced over the wire. This catheter was connected to suction bulb drainage and secured at the skin with a Prolene retention suture and StatLock device. COMPLICATIONS: None FINDINGS: Fluid aspirated from pelvic and more superior abdominal components of large peritoneal abscess fluid collections yielded cloudy yellow fluid. A sample was sent for culture analysis. IMPRESSION: Drainage of 2 separate components of massive intraperitoneal abscesses under CT guidance with placement of 2 separate 12 French pigtail drains. Both drains were connected to suction bulb drainage. Electronically Signed   By: Irish Lack M.D.   On: 09/26/2015 11:16    Labs:  CBC:  Recent Labs  09/25/15 0525 09/25/15 0615 09/26/15 0713 09/27/15 0550  WBC 11.6* 11.3* 9.1 8.8  HGB 6.2* 6.1* 6.3* 8.1*  HCT 20.3* 19.2* 20.7* 26.0*  PLT 248 230 268 304    COAGS:  Recent Labs  09/18/15 0620  INR 1.45    BMP:  Recent Labs  09/22/15 0714 09/25/15 0525 09/26/15 0713 09/27/15 0550  NA 135 135 138 138  K 3.2* 2.9* 3.6 3.8  CL 107 108 109 110  CO2 GLUCOSE 94 83 105* 99  BUN CALCIUM 7.8* 7.9* 8.1* 8.3*  CREATININE 0.78 0.64 0.66 0.44*  GFRNONAA >60 >60 >60 >60  GFRAA >60 >60 >60 >60      LIVER FUNCTION TESTS:  Recent Labs  09/18/15 0702 09/26/15 0713  BILITOT 0.8 0.5  AST 53* 19  ALT 27 15*  ALKPHOS 130* 74  PROT 5.0* 4.4*  ALBUMIN 2.0* <1.0*    Assessment and Plan: 1. Intra-abdominal fluid collections, drains x2 placed on 7/21 -cont with current drain care.  When output below 15cc/day, then plan for repeat CT scan. -CX from drains are negative -patient had g-tube request placed initially, but given the amount of intra-abdominal fluid collections, pancreatitis etc, this was turned down for now.  In evaluating the patient currently, he does not appear to have much quality of life.  Consider a goals of care conversation with the family prior to consideration of g-tube placement once intra-abdominal processes improve.  Electronically Signed: Letha Cape 09/29/2015, 9:43 AM   I spent a total of 15 Minutes at the the patient's bedside AND on the patient's hospital floor or unit, greater than 50% of which was counseling/coordinating care for intra-abdominal fluid collections

## 2015-09-30 LAB — AEROBIC/ANAEROBIC CULTURE W GRAM STAIN (SURGICAL/DEEP WOUND): Special Requests: NORMAL

## 2015-09-30 LAB — AEROBIC/ANAEROBIC CULTURE (SURGICAL/DEEP WOUND): CULTURE: NO GROWTH

## 2015-10-01 ENCOUNTER — Other Ambulatory Visit (HOSPITAL_COMMUNITY): Payer: Medicare Other

## 2015-10-01 LAB — BASIC METABOLIC PANEL
Anion gap: 5 (ref 5–15)
BUN: 10 mg/dL (ref 6–20)
CALCIUM: 7.9 mg/dL — AB (ref 8.9–10.3)
CHLORIDE: 101 mmol/L (ref 101–111)
CO2: 26 mmol/L (ref 22–32)
Creatinine, Ser: 0.45 mg/dL — ABNORMAL LOW (ref 0.61–1.24)
GFR calc Af Amer: >60 mL/min (ref >60)
GFR calc non Af Amer: >60 mL/min (ref >60)
GLUCOSE: 94 mg/dL (ref 65–99)
Potassium: 3.6 mmol/L (ref 3.5–5.1)
Sodium: 132 mmol/L — ABNORMAL LOW (ref 135–145)

## 2015-10-01 LAB — CBC
HEMATOCRIT: 23.7 % — AB (ref 39.0–52.0)
HEMOGLOBIN: 7.4 g/dL — AB (ref 13.0–17.0)
MCH: 26.6 pg (ref 26.0–34.0)
MCHC: 31.2 g/dL (ref 30.0–36.0)
MCV: 85.3 fL (ref 78.0–100.0)
Platelets: 382 10*3/uL (ref 150–400)
RBC: 2.78 MIL/uL — ABNORMAL LOW (ref 4.22–5.81)
RDW: 16.1 % — ABNORMAL HIGH (ref 11.5–15.5)
WBC: 9.6 10*3/uL (ref 4.0–10.5)

## 2015-10-02 ENCOUNTER — Encounter (HOSPITAL_BASED_OUTPATIENT_CLINIC_OR_DEPARTMENT_OTHER): Payer: Medicare Other

## 2015-10-02 DIAGNOSIS — M79609 Pain in unspecified limb: Secondary | ICD-10-CM

## 2015-10-02 DIAGNOSIS — M7989 Other specified soft tissue disorders: Secondary | ICD-10-CM

## 2015-10-02 DIAGNOSIS — R609 Edema, unspecified: Secondary | ICD-10-CM

## 2015-10-02 NOTE — Progress Notes (Addendum)
**  Preliminary report by tech** Right upper extremity venous duplex performed. Technically limited study due to trach tubes, bandages, patient muscular contracture, and unresponsiveness. No obvious deep vein thrombosis in the visualized deep system veins. Superficial, non-occlusive thrombus noted in a small segment of the right cephalic vein in the forearm. Incidental finding of a hematoma on the right upper arm. Results given to the patient's nurse, Dois Davenport.  10/02/15 11:07 AM  Olen Cordial RVT

## 2015-10-03 NOTE — Progress Notes (Signed)
Patient ID: Julian Cordova, male   DOB: 23-Dec-1926, 82 y.o.   MRN: [id number]    Referring Physician(s): Hijazi,A  Supervising Physician: Richarda Overlie  Patient Status:  Inpatient  Chief Complaint:  Intra-abdominal fluid collections  Subjective: Patient lying in bed, contracted positioning, does not respond to questions   Allergies: Review of patient's allergies indicates no known allergies.  Medications: Prior to Admission medications   Not on File     Vital Signs: BP (!) 148/64   Pulse 82   Resp (!) 22   SpO2 100%   Physical Exam abdomen soft, left abdominal drains with light yellow  serous fluid /fibrinous debris in JP bulb; dressings dry; negative cultures  Imaging: Dg Chest Port 1 View  Result Date: 10/01/2015 CLINICAL DATA:  Respiratory failure. EXAM: PORTABLE CHEST 1 VIEW COMPARISON:  09/24/2015 . FINDINGS: Tracheostomy tube and feeding tube in stable position. Right PICC line stable position. Cardiomegaly with diffuse bilateral pulmonary infiltrates and bilateral pleural effusions. Findings consistent with congestive heart failure. Slight progress from prior exam. Bilateral pneumonia cannot be excluded. No pneumothorax. IMPRESSION: 1. Tracheostomy tube and feeding tube in stable position. Right PICC line stable position. 2. Cardiomegaly with pulmonary vascular prominence, bilateral interstitial prominence, and bilateral pleural effusions. Findings consistent congestive heart failure. Slight progression from prior exam. Bilateral pneumonia cannot be excluded. Electronically Signed   By: Maisie Fus  Register   On: 10/01/2015 07:24   Labs:  CBC:  Recent Labs  09/25/15 0615 09/26/15 0713 09/27/15 0550 10/01/15 0645  WBC 11.3* 9.1 8.8 9.6  HGB 6.1* 6.3* 8.1* 7.4*  HCT 19.2* 20.7* 26.0* 23.7*  PLT 230 268 304 382    COAGS:  Recent Labs  09/18/15 0620  INR 1.45    BMP:  Recent Labs  09/25/15 0525 09/26/15 0713 09/27/15 0550 10/01/15 0645  NA 135 138  138 132*  K 2.9* 3.6 3.8 3.6  CL 108 109 110 101  CO2 24 24 25 26   GLUCOSE 83 105* 99 94  BUN 18 17 15 10   CALCIUM 7.9* 8.1* 8.3* 7.9*  CREATININE 0.64 0.66 0.44* 0.45*  GFRNONAA >60 >60 >60 >60  GFRAA >60 >60 >60 >60    LIVER FUNCTION TESTS:  Recent Labs  09/18/15 0702 09/26/15 0713  BILITOT 0.8 0.5  AST 53* 19  ALT 27 15*  ALKPHOS 130* 74  PROT 5.0* 4.4*  ALBUMIN 2.0* <1.0*    Assessment and Plan: Intra-abdominal fluid collections, drains x2 placed on 7/21; currently afebrile; drain fluid cultures negative; recommend follow-up CT on 7/31 to assess adequacy of fluid collection drainage   Electronically Signed: D. Jeananne Rama 10/03/2015, 2:31 PM   I spent a total of 15 minutes at the the patient's bedside AND on the patient's hospital floor or unit, greater than 50% of which was counseling/coordinating care for dominant fluid collection drainages

## 2015-10-05 ENCOUNTER — Other Ambulatory Visit (HOSPITAL_COMMUNITY): Payer: Medicare Other

## 2015-10-05 ENCOUNTER — Encounter: Payer: Self-pay | Admitting: Radiology

## 2015-10-05 LAB — TROPONIN I
Troponin I: <0.03 ng/mL (ref <0.03)
Troponin I: <0.03 ng/mL (ref <0.03)

## 2015-10-05 MED ORDER — IOPAMIDOL (ISOVUE-300) INJECTION 61%
100.0000 mL | Freq: Once | INTRAVENOUS | Status: AC | PRN
  Administered 2015-10-13: 100 mL via INTRAVENOUS

## 2015-10-08 LAB — COMPREHENSIVE METABOLIC PANEL
ALT: 21 U/L (ref 17–63)
AST: 30 U/L (ref 15–41)
Albumin: 1.1 g/dL — ABNORMAL LOW (ref 3.5–5.0)
Alkaline Phosphatase: 96 U/L (ref 38–126)
Anion gap: 7 (ref 5–15)
BUN: 22 mg/dL — AB (ref 6–20)
CO2: 24 mmol/L (ref 22–32)
CREATININE: 0.47 mg/dL — AB (ref 0.61–1.24)
Calcium: 8.3 mg/dL — ABNORMAL LOW (ref 8.9–10.3)
Chloride: 102 mmol/L (ref 101–111)
GFR calc Af Amer: >60 mL/min (ref >60)
GFR calc non Af Amer: >60 mL/min (ref >60)
Glucose, Bld: 115 mg/dL — ABNORMAL HIGH (ref 65–99)
Potassium: 4 mmol/L (ref 3.5–5.1)
SODIUM: 133 mmol/L — AB (ref 135–145)
Total Bilirubin: 0.2 mg/dL — ABNORMAL LOW (ref 0.3–1.2)
Total Protein: 5.6 g/dL — ABNORMAL LOW (ref 6.5–8.1)

## 2015-10-08 LAB — CBC
HCT: 21.3 % — ABNORMAL LOW (ref 39.0–52.0)
Hemoglobin: 6.7 g/dL — CL (ref 13.0–17.0)
MCH: 26.8 pg (ref 26.0–34.0)
MCHC: 31.5 g/dL (ref 30.0–36.0)
MCV: 85.2 fL (ref 78.0–100.0)
PLATELETS: 434 10*3/uL — AB (ref 150–400)
RBC: 2.5 MIL/uL — AB (ref 4.22–5.81)
RDW: 16.5 % — ABNORMAL HIGH (ref 11.5–15.5)
WBC: 10.8 10*3/uL — AB (ref 4.0–10.5)

## 2015-10-08 LAB — PREPARE RBC (CROSSMATCH)

## 2015-10-08 NOTE — Progress Notes (Signed)
Patient ID: Julian Cordova, male   DOB: 07-30-1926, 82 y.o.   MRN: [id number]    Referring Physician(s): Dr. Carron Curie  Supervising Physician: Ruel Favors  Patient Status: inpt  Chief Complaint: Intra-abdominal abscesses  Subjective: Patient relatively unresponsive to questions, just lays in bed.  Allergies: Review of patient's allergies indicates no known allergies.  Medications: Prior to Admission medications   Not on File    Vital Signs: BP (!) 148/64   Pulse 82   Resp (!) 22   SpO2 100%   Physical Exam: Abd: unable to evaluate abdomen as patient is contractured up holding pillows covering his torso.  Unable to get to drain sites.  Drains are actually hanging over the side of the bed and have minimal cloudy serous output.  RN reports 10cc drain from both this am.  Imaging: Ct Abdomen Pelvis W Contrast  Result Date: 10/05/2015 CLINICAL DATA:  Followup exam after drainage of abdominal wall collection. EXAM: CT ABDOMEN AND PELVIS WITH CONTRAST TECHNIQUE: Multidetector CT imaging of the abdomen and pelvis was performed using the standard protocol following bolus administration of intravenous contrast. CONTRAST:  100 mL of Isovue-300 intravenous contrast COMPARISON:  09/25/2015 FINDINGS: Peritoneal cavity: There has been improvement when compared to the prior CT following percutaneous drainage. In the pelvis, and pigtail catheter, inserted from the left lower quadrant, has partly decompressed a collection that extends from the anterior para renal fascia at the level of the midpole the left kidney to the central to upper pelvis. In the pelvis, this measures 4.7 x 4.1 cm. In this location on the prior exam the collection measured 20.1 x 8.1 cm. In the left mid abdomen, the collection currently measures 5.8 x 3.9 cm, previously 10.3 x 9.2 cm. In the right anterior upper to mid abdomen, the previously seen collection has been mostly decompressed with a pigtail catheter. A small amount  of fluid in the right lower quadrant persists as does a thin collection of fluid below the level of the catheter. The right lower quadrant collection measures 3.6 cm. Posterior to the stomach, and anterior to the pancreas, in the upper abdomen, there is an oval collection, undrained, currently measuring 8.9 x 4.4 cm, previously 12.0 x 4.6 cm. The decrease in the size of this collection suggests it may communicate with the previously described collection. There are no new abdominal or pelvic fluid collections. A small amount of free fluid collects primarily in the posterior pelvis. Lung bases: Large right and moderate to large left pleural effusions increased in size from the CT dated 09/24/2015. There is associated dependent atelectasis of most of the left lower lobe and the entire visualized right lower lobe. Milder left upper lobe lingula atelectasis. Hepatobiliary: Liver is unremarkable. Small dependent gallstones. No evidence acute cholecystitis. No bile duct dilation. Spleen, pancreas, adrenal glands:  Unremarkable. Kidneys, ureters, bladder: Kidneys are unremarkable. No hydronephrosis. Ureters not well visualized but are nondilated. The bladder is partly decompressed with Foley catheter. A stone projects along the posterior wall of the bladder. There is nondependent air. Wall may be thickened despite the decompressed state. Lymph nodes:  No discrete enlarged lymph nodes. Gastrointestinal: Enteric tube tip projects in the proximal jejunum. Stomach is decompressed. Sigmoid colon appears to terminate with anastomosis staples in the right lower quadrant. There is a right lower quadrant ostomy. No convincing bowel wall thickening or inflammation. No evidence of bowel obstruction. Abdominal wall:  Few subcutaneous edema. IMPRESSION: 1. There has been partial decompression of 2 of the  peritoneal cavity fluid collections as described above. Another fluid collection, which likely communicates with the mostly  decompressed right anterior mid to upper abdomen collection, measures smaller than it did on the prior exam. 2. No new abdominal or pelvic fluid collections. 3. Increasing pleural effusions, right large left moderate to large, with significant associated lung base atelectasis. Electronically Signed   By: Amie Portland M.D.   On: 10/05/2015 15:21    Labs:  CBC:  Recent Labs  09/26/15 0713 09/27/15 0550 10/01/15 0645 10/08/15 0631  WBC 9.1 8.8 9.6 10.8*  HGB 6.3* 8.1* 7.4* 6.7*  HCT 20.7* 26.0* 23.7* 21.3*  PLT 268 304 382 434*    COAGS:  Recent Labs  09/18/15 0620  INR 1.45    BMP:  Recent Labs  09/26/15 0713 09/27/15 0550 10/01/15 0645 10/08/15 0631  NA 138 138 132* 133*  K 3.6 3.8 3.6 4.0  CL 109 110 101 102  CO2 GLUCOSE 105* 99 94 115*  BUN 22*  CALCIUM 8.1* 8.3* 7.9* 8.3*  CREATININE 0.66 0.44* 0.45* 0.47*  GFRNONAA >60 >60 >60 >60  GFRAA >60 >60 >60 >60    LIVER FUNCTION TESTS:  Recent Labs  09/18/15 0702 09/26/15 0713 10/08/15 0631  BILITOT 0.8 0.5 0.2*  AST 53* 19 30  ALT 27 15* 21  ALKPHOS 130* 74 96  PROT 5.0* 4.4* 5.6*  ALBUMIN 2.0* <1.0* 1.1*    Assessment and Plan: 1. Intra-abdominal abscesses, s/p perc drain x2, 7/21 -drains are stable with minimal output.  Cont with drain flushes. -last CT was on 7/31.  Sometime within the next 2-4 days, consider repeat CT scan to further evaluate the drains. -will follow.  Electronically Signed: Letha Cape 10/08/2015, 12:19 PM   I spent a total of 15 Minutes at the the patient's bedside AND on the patient's hospital floor or unit, greater than 50% of which was counseling/coordinating care for intra-abdominal abscesses

## 2015-10-09 LAB — TYPE AND SCREEN
ABO/RH(D): O POS
Antibody Screen: NEGATIVE
UNIT DIVISION: 0

## 2015-10-10 ENCOUNTER — Other Ambulatory Visit (HOSPITAL_COMMUNITY): Payer: Medicare Other

## 2015-10-10 LAB — CBC
HCT: 22.4 % — ABNORMAL LOW (ref 39.0–52.0)
HEMOGLOBIN: 7.1 g/dL — AB (ref 13.0–17.0)
MCH: 27.3 pg (ref 26.0–34.0)
MCHC: 31.7 g/dL (ref 30.0–36.0)
MCV: 86.2 fL (ref 78.0–100.0)
Platelets: 351 10*3/uL (ref 150–400)
RBC: 2.6 MIL/uL — ABNORMAL LOW (ref 4.22–5.81)
RDW: 16.2 % — ABNORMAL HIGH (ref 11.5–15.5)
WBC: 9.7 10*3/uL (ref 4.0–10.5)

## 2015-10-13 ENCOUNTER — Other Ambulatory Visit (HOSPITAL_COMMUNITY): Payer: Medicare Other

## 2015-10-14 ENCOUNTER — Other Ambulatory Visit (HOSPITAL_COMMUNITY): Payer: Medicare Other

## 2015-10-14 MED ORDER — LIDOCAINE VISCOUS 2 % MT SOLN
15.0000 mL | Freq: Once | OROMUCOSAL | Status: AC
  Administered 2015-10-14: 2.5 mL via OROMUCOSAL

## 2015-10-14 MED ORDER — DIATRIZOATE MEGLUMINE & SODIUM 66-10 % PO SOLN
30.0000 mL | Freq: Once | ORAL | Status: AC
  Administered 2015-10-14: 30 mL via ORAL

## 2015-10-15 LAB — SUSCEPTIBILITY RESULT

## 2015-10-15 LAB — SUSCEPTIBILITY, AER + ANAEROB

## 2015-10-17 ENCOUNTER — Other Ambulatory Visit (HOSPITAL_COMMUNITY): Payer: Medicare Other

## 2015-10-17 LAB — BASIC METABOLIC PANEL
Anion gap: 8 (ref 5–15)
BUN: 22 mg/dL — AB (ref 6–20)
CALCIUM: 8.1 mg/dL — AB (ref 8.9–10.3)
CHLORIDE: 110 mmol/L (ref 101–111)
CO2: 21 mmol/L — AB (ref 22–32)
CREATININE: 0.53 mg/dL — AB (ref 0.61–1.24)
GFR calc non Af Amer: >60 mL/min (ref >60)
GLUCOSE: 94 mg/dL (ref 65–99)
Potassium: 3.4 mmol/L — ABNORMAL LOW (ref 3.5–5.1)
Sodium: 139 mmol/L (ref 135–145)

## 2015-10-17 LAB — CBC
HEMATOCRIT: 23.8 % — AB (ref 39.0–52.0)
HEMOGLOBIN: 7.4 g/dL — AB (ref 13.0–17.0)
MCH: 27 pg (ref 26.0–34.0)
MCHC: 31.1 g/dL (ref 30.0–36.0)
MCV: 86.9 fL (ref 78.0–100.0)
Platelets: 416 10*3/uL — ABNORMAL HIGH (ref 150–400)
RBC: 2.74 MIL/uL — ABNORMAL LOW (ref 4.22–5.81)
RDW: 16.8 % — AB (ref 11.5–15.5)
WBC: 14.8 10*3/uL — ABNORMAL HIGH (ref 4.0–10.5)

## 2015-10-18 LAB — BASIC METABOLIC PANEL
Anion gap: 3 — ABNORMAL LOW (ref 5–15)
BUN: 20 mg/dL (ref 6–20)
CO2: 22 mmol/L (ref 22–32)
CREATININE: 0.49 mg/dL — AB (ref 0.61–1.24)
Calcium: 8 mg/dL — ABNORMAL LOW (ref 8.9–10.3)
Chloride: 113 mmol/L — ABNORMAL HIGH (ref 101–111)
Glucose, Bld: 113 mg/dL — ABNORMAL HIGH (ref 65–99)
Potassium: 3.4 mmol/L — ABNORMAL LOW (ref 3.5–5.1)
SODIUM: 138 mmol/L (ref 135–145)

## 2015-10-19 LAB — BASIC METABOLIC PANEL
Anion gap: 3 — ABNORMAL LOW (ref 5–15)
BUN: 20 mg/dL (ref 6–20)
CALCIUM: 8.2 mg/dL — AB (ref 8.9–10.3)
CO2: 22 mmol/L (ref 22–32)
CREATININE: 0.55 mg/dL — AB (ref 0.61–1.24)
Chloride: 113 mmol/L — ABNORMAL HIGH (ref 101–111)
GFR calc Af Amer: >60 mL/min (ref >60)
GFR calc non Af Amer: >60 mL/min (ref >60)
GLUCOSE: 123 mg/dL — AB (ref 65–99)
Potassium: 3.8 mmol/L (ref 3.5–5.1)
Sodium: 138 mmol/L (ref 135–145)

## 2015-10-20 LAB — BASIC METABOLIC PANEL
Anion gap: 4 — ABNORMAL LOW (ref 5–15)
BUN: 25 mg/dL — AB (ref 6–20)
CALCIUM: 8.5 mg/dL — AB (ref 8.9–10.3)
CO2: 21 mmol/L — AB (ref 22–32)
CREATININE: 0.57 mg/dL — AB (ref 0.61–1.24)
Chloride: 116 mmol/L — ABNORMAL HIGH (ref 101–111)
GFR calc Af Amer: >60 mL/min (ref >60)
GFR calc non Af Amer: >60 mL/min (ref >60)
GLUCOSE: 90 mg/dL (ref 65–99)
Potassium: 4.1 mmol/L (ref 3.5–5.1)
Sodium: 141 mmol/L (ref 135–145)

## 2015-10-20 LAB — URINALYSIS, ROUTINE W REFLEX MICROSCOPIC
BILIRUBIN URINE: NEGATIVE
GLUCOSE, UA: NEGATIVE mg/dL
Ketones, ur: NEGATIVE mg/dL
Nitrite: NEGATIVE
PROTEIN: 100 mg/dL — AB
Specific Gravity, Urine: 1.026 (ref 1.005–1.030)
pH: 5.5 (ref 5.0–8.0)

## 2015-10-20 LAB — CULTURE, RESPIRATORY W GRAM STAIN

## 2015-10-20 LAB — CULTURE, RESPIRATORY

## 2015-10-20 LAB — URINE MICROSCOPIC-ADD ON

## 2015-10-20 LAB — CBC
HCT: 24.3 % — ABNORMAL LOW (ref 39.0–52.0)
Hemoglobin: 7.4 g/dL — ABNORMAL LOW (ref 13.0–17.0)
MCH: 27.6 pg (ref 26.0–34.0)
MCHC: 30.5 g/dL (ref 30.0–36.0)
MCV: 90.7 fL (ref 78.0–100.0)
PLATELETS: 414 10*3/uL — AB (ref 150–400)
RBC: 2.68 MIL/uL — ABNORMAL LOW (ref 4.22–5.81)
RDW: 18.2 % — ABNORMAL HIGH (ref 11.5–15.5)
WBC: 14.3 10*3/uL — ABNORMAL HIGH (ref 4.0–10.5)

## 2015-10-22 LAB — URINE CULTURE: Culture: >=100000 — AB

## 2016-02-05 DEATH — deceased

## 2017-05-19 IMAGING — CR DG CHEST 1V PORT
1 series · 1 of 1 positions shown · non-contrast
Comparison: Chest radiograph 10/01/2015

CLINICAL DATA: Patient with history of pneumonia.

EXAM:
PORTABLE CHEST 1 VIEW

[AP]
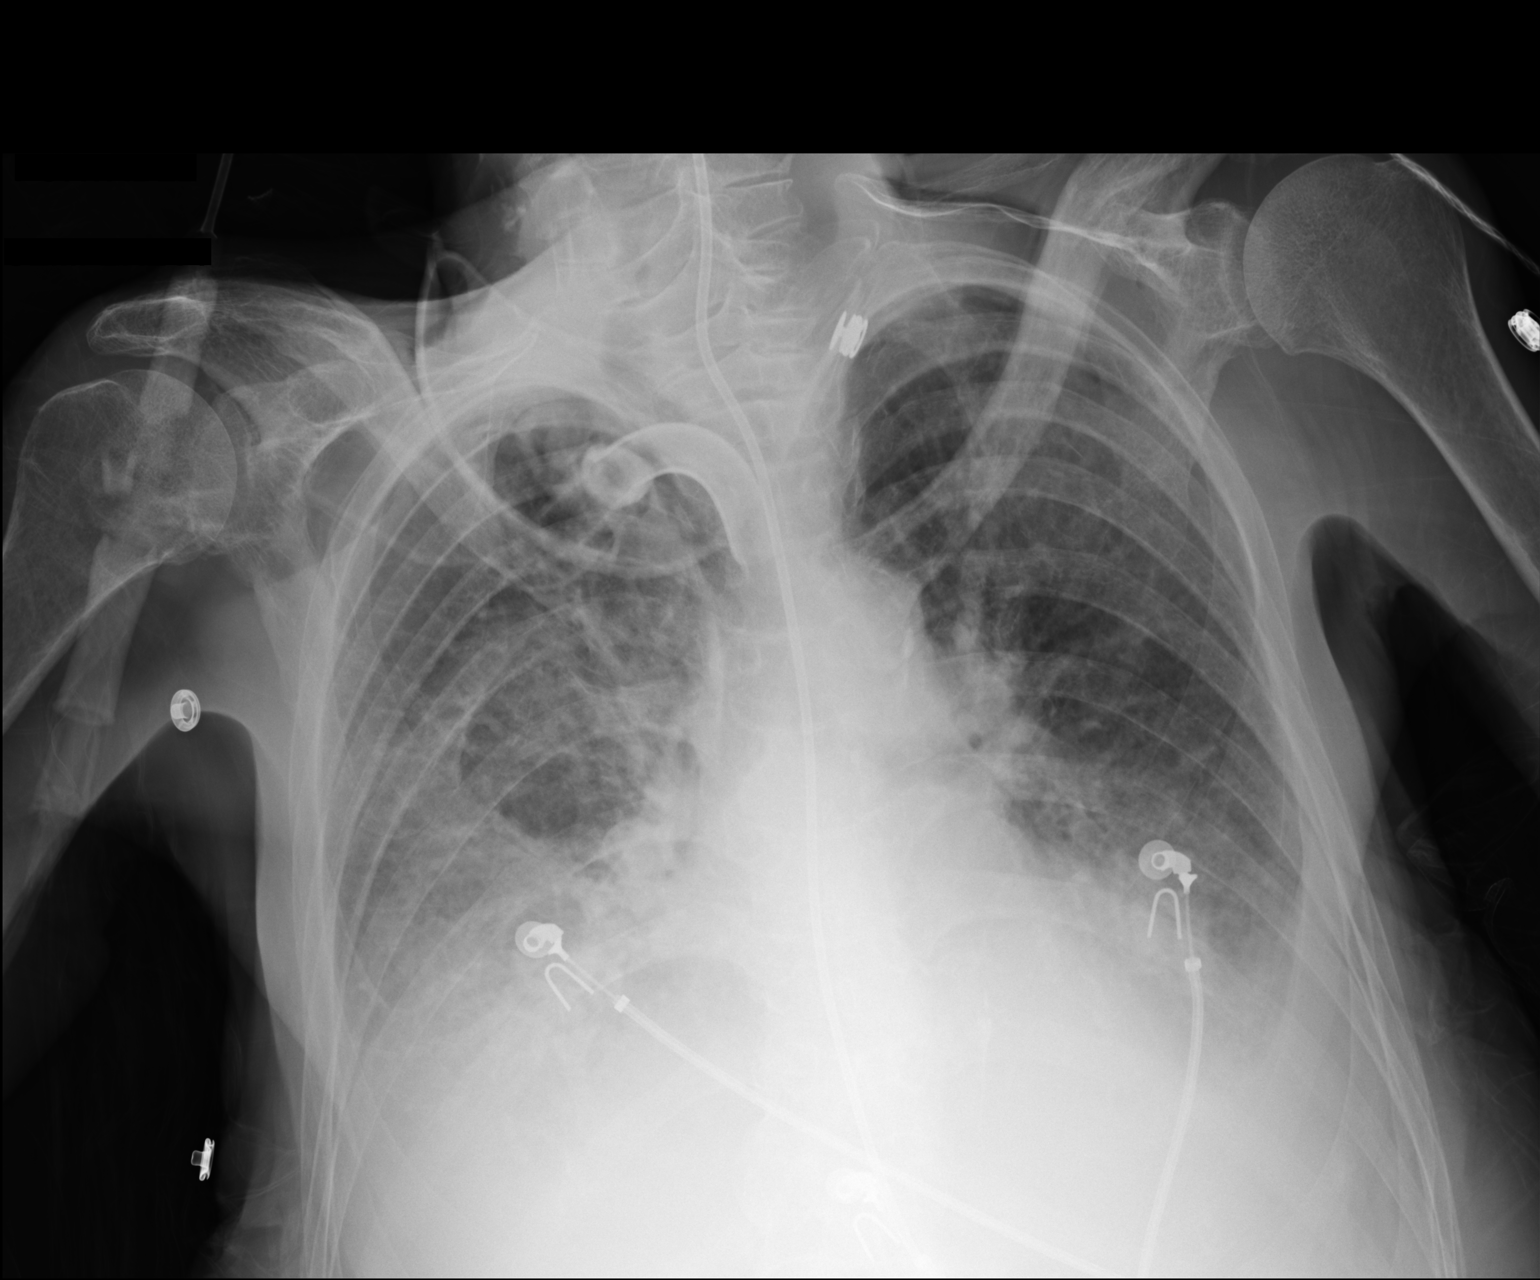

[1 of 1 positions shown; findings below may reference images not displayed]

FINDINGS: Tracheostomy tube terminates in the mid trachea. Enteric tube
courses inferior to the diaphragm. Interval removal right upper
extremity PICC line. Monitoring leads overlie the patient. Stable
cardiac and mediastinal contours. Persistent layering moderate
bilateral pleural effusions with underlying pulmonary consolidation.
IMPRESSION: Stable moderate layering bilateral pleural effusions and underlying
pulmonary consolidation.

Interval removal right upper extremity PICC line.

## 2017-05-22 IMAGING — CT CT ABD-PELV W/ CM
2 of 3 series · 11 of 36 positions shown, 15 images · IV contrast (APPLIED)
Comparison: 10/05/2015

CLINICAL DATA: Intra-abdominal abscess. Status post percutaneous
drain x2. Minimal output. Followup drains.

EXAM:
CT ABDOMEN AND PELVIS WITH CONTRAST
TECHNIQUE: Multidetector CT imaging of the abdomen and pelvis was performed
using the standard protocol following bolus administration of
intravenous contrast.
CONTRAST:  100mL IA4VEF-6TT IOPAMIDOL (IA4VEF-6TT) INJECTION 61%

[Series 3: abd/ pelvis 5.0 i30f 1 · axial · 0.88mm/px · z∈[+1156,+1596]mm · 10 of 103 slices shown, 13 images]
[im 10/103  soft-tissue]
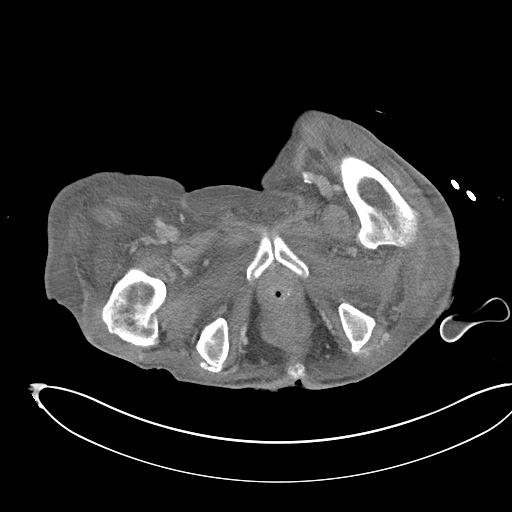
[im 10/103  bone]
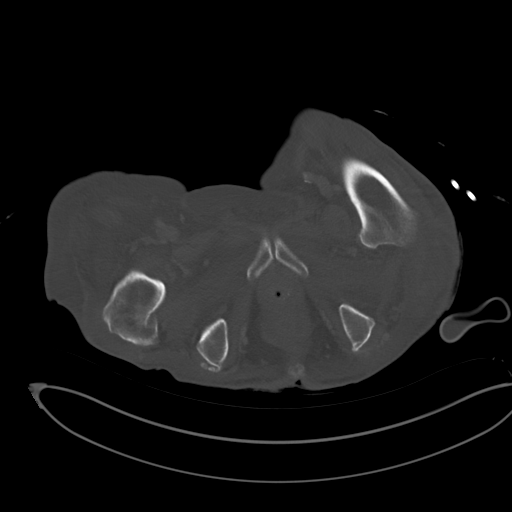
[im 19/103  soft-tissue]
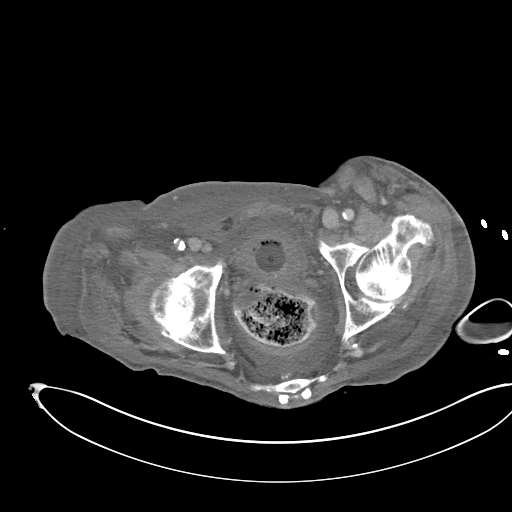
[im 33/103  soft-tissue]
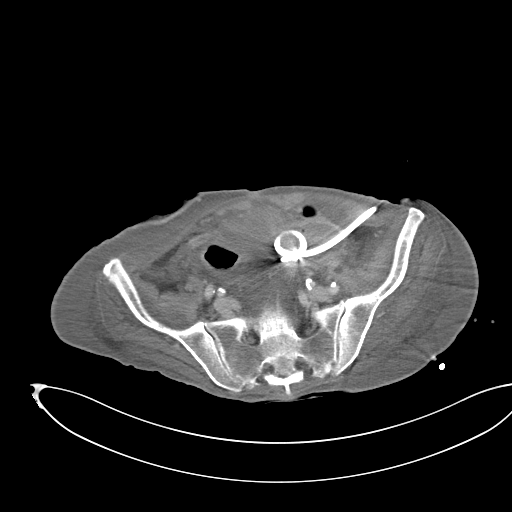
[im 47/103  soft-tissue]
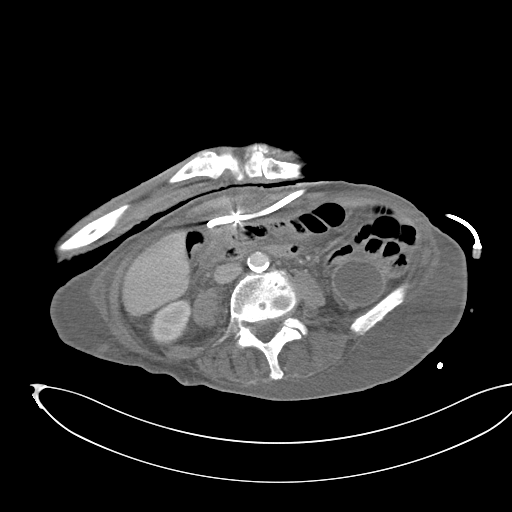
[im 56/103  soft-tissue]
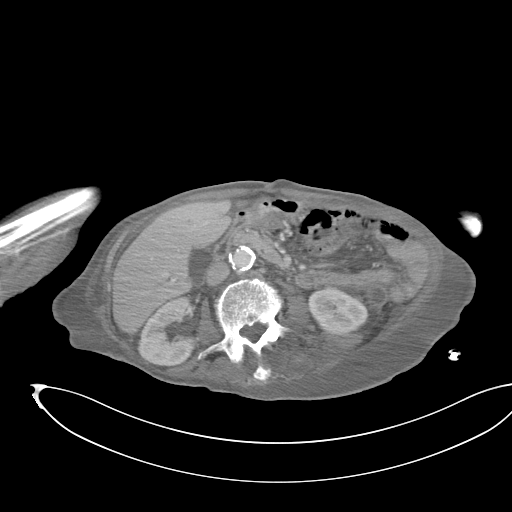
[im 70/103  soft-tissue]
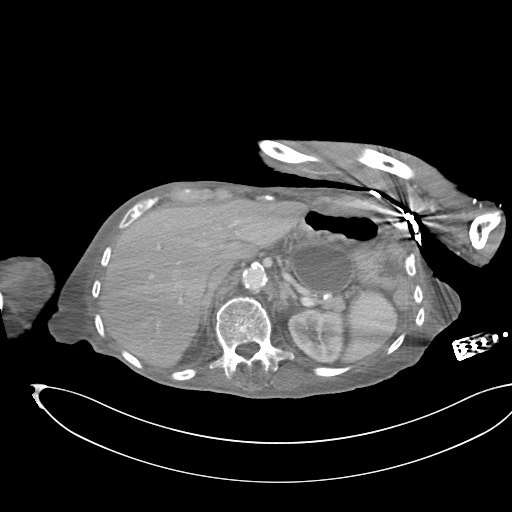
[im 84/103  soft-tissue]
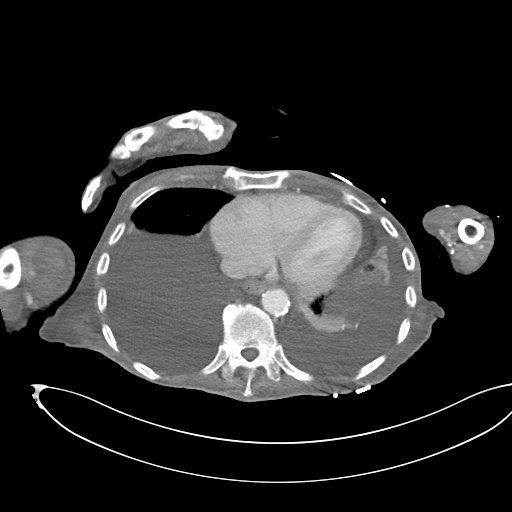
[im 84/103  lung]
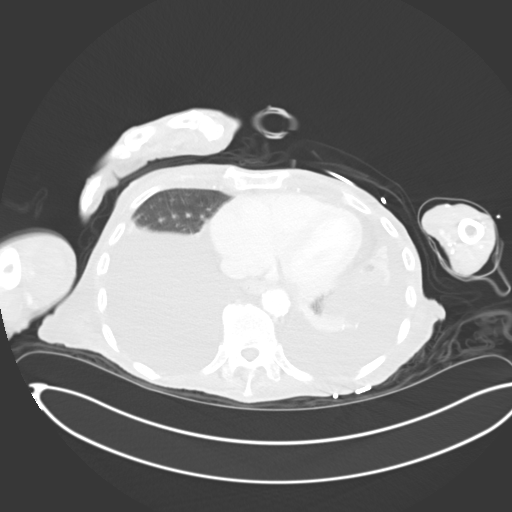
[im 89/103  lung]
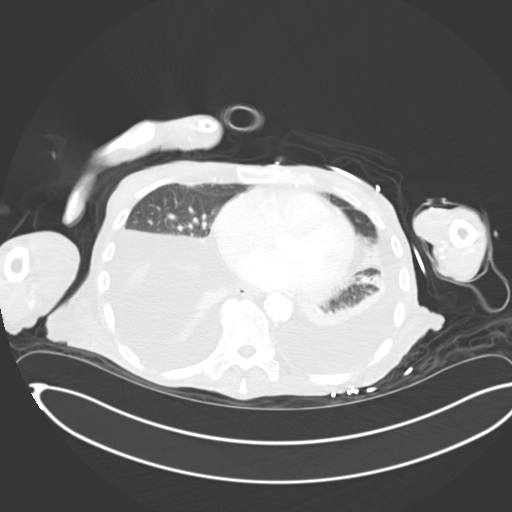
[im 93/103  soft-tissue]
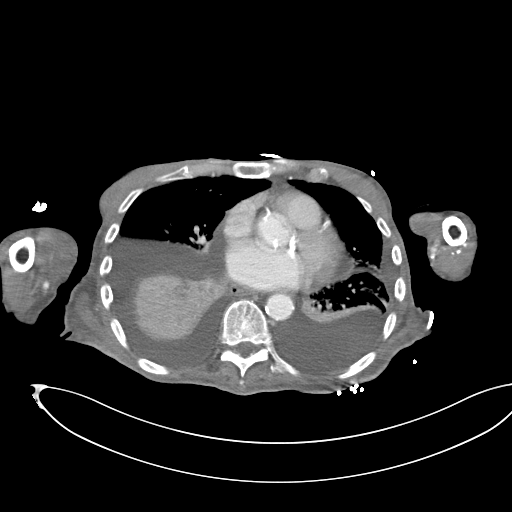
[im 93/103  lung]
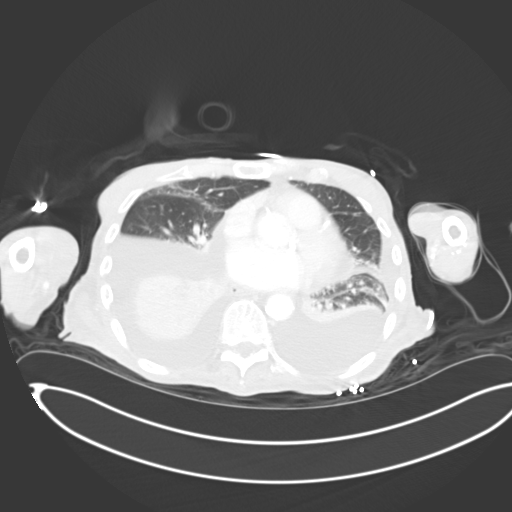
[im 98/103  lung]
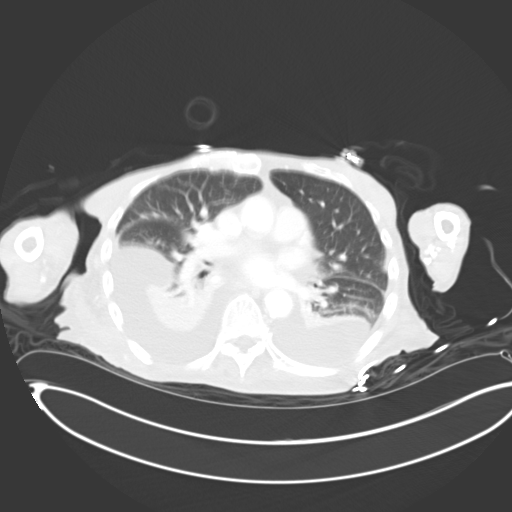

[Series 7: sagittal soft tissue · sagittal · 0.39mm/px · 1 of 113 slices shown, 2 images]
[im 38/113  soft-tissue]
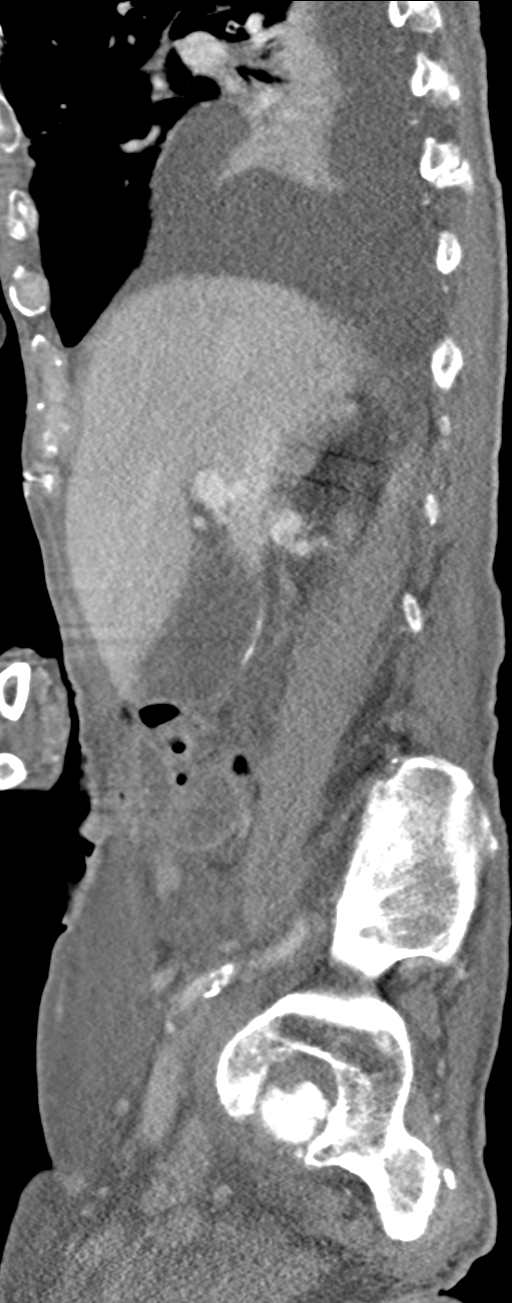
[im 38/113  bone]
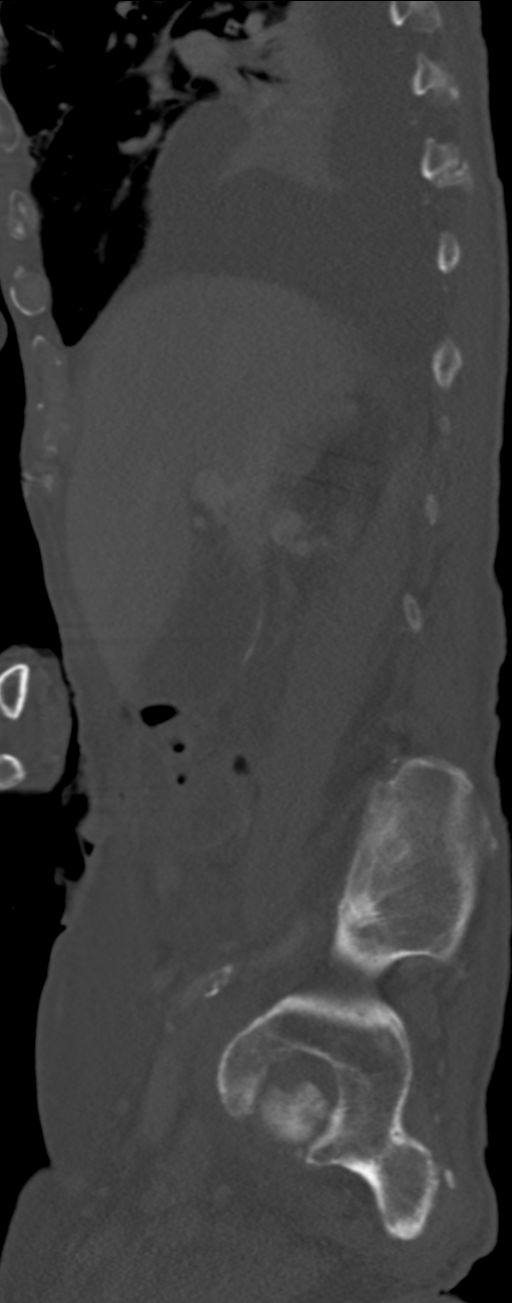

[11 of 36 positions shown; findings below may reference images not displayed]

FINDINGS: Lower chest: Bilateral pleural effusions are again noted. There is
bibasilar atelectasis. The effusions are smaller when compared prior
study. Coronary artery calcifications and mitral annulus
calcifications again noted.

Hepatobiliary: Liver is homogeneous. Gallbladder is present.
Possible layering small stones.

Pancreas: Atrophic and partially calcified pancreas.

Spleen: Normal in appearance.

Renal/Adrenal: Normal appearance of the adrenal glands. Normal
appearance of the kidneys.

Gastrointestinal tract: Feeding tube has been removed. Bowel loops
are decompressed. There is a moderate amount of stool within the
rectosigmoid colon.

Reproductive/Pelvis: Foley catheter decompresses the bladder.

Vascular/Lymphatic: Significant atherosclerotic calcification of the
abdominal aorta.

Musculoskeletal/Abdominal wall: Patient is cachectic. Significant
anasarca.

Other: Persistent crescentic fluid collection identified, partially
drained by a a left lower quadrant pigtail type catheter. The upper
portion now measures 4.4 x 3.7 cm and previously measured 3.9 x
cm. The lower portion measures 3.7 x 11.1 cm and previously measured
4.1 x 12.7 cm. A fluid collection anterior to the pancreas is 7.8 x
4.2 cm and previously measured 4.4 x 8.9 cm. Within the right
central pelvis there is a small fluid collection similar in
appearance to prior study but now measuring 2.9 cm, previously
cm. A pigtail type catheter is identified in the anterior central
abdomen, completely decompressing a previously identified fluid
collection. Presacral fluid attenuation is consistent with third
space fluid.No new fluid collections are identified.
IMPRESSION: 1. Fluid collections are slightly smaller but persistent. No new
fluid collections are identified.
2. Significant body wall edema and presacral edema.
3. Small bilateral pleural effusions.
4. Cholelithiasis.
5. Enteric feeding tube has been removed.
6. Foley catheter.
